# Patient Record
Sex: Male | Born: 1985
Health system: Southern US, Community
[De-identification: ages and names within clinical notes are randomized; demographics above are authoritative.]

## PROBLEM LIST (undated history)

## (undated) DIAGNOSIS — E782 Mixed hyperlipidemia: Secondary | ICD-10-CM

## (undated) HISTORY — DX: Mixed hyperlipidemia: E78.2

---

## 2017-07-21 ENCOUNTER — Encounter (HOSPITAL_COMMUNITY): Payer: Self-pay

## 2017-07-21 ENCOUNTER — Emergency Department (HOSPITAL_COMMUNITY)
Admission: EM | Admit: 2017-07-21 | Discharge: 2017-07-21 | Disposition: A | Discharge status: Home / Self Care | Payer: Medicaid Other | Attending: Emergency Medicine | Admitting: Emergency Medicine

## 2017-07-21 ENCOUNTER — Emergency Department (HOSPITAL_COMMUNITY): Payer: Medicaid Other

## 2017-07-21 ENCOUNTER — Other Ambulatory Visit: Payer: Self-pay

## 2017-07-21 DIAGNOSIS — M940 Chondrocostal junction syndrome [Tietze]: Secondary | ICD-10-CM

## 2017-07-21 DIAGNOSIS — R0789 Other chest pain: Secondary | ICD-10-CM

## 2017-07-21 DIAGNOSIS — R079 Chest pain, unspecified: Secondary | ICD-10-CM | POA: Diagnosis present

## 2017-07-21 LAB — BASIC METABOLIC PANEL
Anion gap: 9 (ref 5–15)
BUN: 17 mg/dL (ref 6–20)
CHLORIDE: 102 mmol/L (ref 101–111)
CO2: 26 mmol/L (ref 22–32)
CREATININE: 0.92 mg/dL (ref 0.61–1.24)
Calcium: 9.7 mg/dL (ref 8.9–10.3)
GFR calc Af Amer: >60 mL/min (ref >60)
GFR calc non Af Amer: >60 mL/min (ref >60)
Glucose, Bld: 111 mg/dL — ABNORMAL HIGH (ref 65–99)
Potassium: 4.3 mmol/L (ref 3.5–5.1)
SODIUM: 137 mmol/L (ref 135–145)

## 2017-07-21 LAB — CBC
HCT: 46.5 % (ref 39.0–52.0)
Hemoglobin: 16.4 g/dL (ref 13.0–17.0)
MCH: 30.4 pg (ref 26.0–34.0)
MCHC: 35.3 g/dL (ref 30.0–36.0)
MCV: 86.1 fL (ref 78.0–100.0)
Platelets: 261 10*3/uL (ref 150–400)
RBC: 5.4 MIL/uL (ref 4.22–5.81)
RDW: 12.9 % (ref 11.5–15.5)
WBC: 5.7 10*3/uL (ref 4.0–10.5)

## 2017-07-21 LAB — I-STAT TROPONIN, ED
TROPONIN I, POC: 0 ng/mL (ref 0.00–0.08)
Troponin i, poc: 0 ng/mL (ref 0.00–0.08)

## 2017-07-21 MED ORDER — IBUPROFEN 400 MG PO TABS
600.0000 mg | ORAL_TABLET | Freq: Once | ORAL | Status: AC
  Administered 2017-07-21: 15:00:00 600 mg via ORAL
  Filled 2017-07-21: qty 1

## 2017-07-21 MED ORDER — IBUPROFEN 600 MG PO TABS: 600.0000 mg | ORAL_TABLET | Freq: Four times a day (QID) | ORAL | 0 refills | Status: AC | PRN

## 2017-07-21 MED ORDER — OMEPRAZOLE 20 MG PO CPDR: 20.0000 mg | DELAYED_RELEASE_CAPSULE | Freq: Every day | ORAL | 0 refills | Status: DC

## 2017-07-21 NOTE — ED Provider Notes (Signed)
[facility] Provider Note  CSN: [id number] Arrival date & time: 07/21/17  1101 History   Chief Complaint Chief Complaint  Patient presents with  . Chest Pain   HPI Travis Meadows is a 31 y.o. male.  The history is provided by the patient.  Chest Pain   This is a new problem. The current episode started 2 days ago. The problem occurs constantly. The problem has not changed since onset.The pain is associated with movement, raising an arm and lifting. The pain is present in the substernal region. The pain is mild. The quality of the pain is described as heavy. The pain does not radiate. Duration of episode(s) is 2 days. The symptoms are aggravated by deep breathing (moving, lifting). Pertinent negatives include no abdominal pain, no back pain, no cough, no fever, no palpitations, no shortness of breath and no vomiting. He has tried nothing for the symptoms. Risk factors include male gender.  Pertinent negatives for past medical history include no CAD, no COPD, no CHF, no diabetes, no DVT, no MI, no PE, no PVD and no recent injury.   History reviewed. No pertinent past medical history.  There are no active problems to display for this patient.  History reviewed. No pertinent surgical history.  Home Medications    Prior to Admission medications   Not on File   Family History History reviewed. No pertinent family history.  Social History Social History   Tobacco Use  . Smoking status: Never Smoker  Substance Use Topics  . Alcohol use: No    Frequency: Never  . Drug use: No   Allergies   Patient has no known allergies.  Review of Systems Review of Systems  Constitutional: Negative for chills and fever.  HENT: Negative for ear pain and sore throat.   Eyes: Negative for pain and visual disturbance.  Respiratory: Negative for cough and shortness of breath.   Cardiovascular: Positive for chest pain. Negative for palpitations.    Gastrointestinal: Negative for abdominal pain and vomiting.  Genitourinary: Negative for dysuria and hematuria.  Musculoskeletal: Negative for arthralgias and back pain.  Skin: Negative for color change and rash.  Neurological: Negative for syncope.  All other systems reviewed and are negative.  Physical Exam Updated Vital Signs BP 130/84 (BP Location: Right Arm) Comment: Simultaneous filing. User may not have seen previous data.  Pulse 64   Temp 98.4 F (36.9 C) (Oral)   Resp 20 Comment: Simultaneous filing. User may not have seen previous data.  Ht 5\' 7"  (1.702 m)   Wt 90.7 kg (200 lb)   SpO2 99%   BMI 31.32 kg/m   Physical Exam  Constitutional: He is oriented to person, place, and time. He appears well-developed and well-nourished.  HENT:  Head: Normocephalic and atraumatic.  Eyes: Conjunctivae and EOM are normal. Pupils are equal, round, and reactive to light.  Neck: Normal range of motion. Neck supple.  Cardiovascular: Normal rate, regular rhythm, intact distal pulses and normal pulses.  No murmur heard. Pulmonary/Chest: Effort normal and breath sounds normal. No respiratory distress. He has no decreased breath sounds. He exhibits tenderness (over the sternal area). He exhibits no crepitus, no edema and no swelling.  Abdominal: Soft. There is no tenderness.  Musculoskeletal: Normal range of motion. He exhibits no edema.       Right lower leg: He exhibits no tenderness and no edema.       Left lower leg: He exhibits no tenderness and no edema.  Neurological: He is alert and oriented to person, place, and time.  Skin: Skin is warm and dry.  Psychiatric: He has a normal mood and affect. His behavior is normal.  Nursing note and vitals reviewed.  ED Treatments / Results  Labs (all labs ordered are listed, but only abnormal results are displayed) Labs Reviewed  BASIC METABOLIC PANEL - Abnormal; Notable for the following components:      Result Value   Glucose, Bld 111  (*)    All other components within normal limits  CBC  I-STAT TROPONIN, ED  I-STAT TROPONIN, ED   EKG  EKG Interpretation  Date/Time:  Friday July 21 2017 11:23:19 EST Ventricular Rate:  69 PR Interval:  154 QRS Duration: 86 QT Interval:  358 QTC Calculation: 383 R Axis:   64 Text Interpretation:  Normal sinus rhythm with sinus arrhythmia Normal ECG No old tracing to compare Confirmed by Azalia Bilisampos, Kevin ([id number]) on 07/21/2017 3:22:22 PM      Radiology Dg Chest 2 View  Result Date: 07/21/2017 CLINICAL DATA:  Chest pain EXAM: CHEST  2 VIEW COMPARISON:  None. FINDINGS: Normal heart size. Normal mediastinal contour. No pneumothorax. No pleural effusion. Lungs appear clear, with no acute consolidative airspace disease and no pulmonary edema. IMPRESSION: No active cardiopulmonary disease. Electronically Signed   By: Delbert PhenixJason A Poff M.D.   On: 07/21/2017 12:15   Procedures Procedures (including critical care time)  Medications Ordered in ED Medications  ibuprofen (ADVIL,MOTRIN) tablet 600 mg (600 mg Oral Given 07/21/17 1520)   Initial Impression / Assessment and Plan / ED Course  I have reviewed the triage vital signs and the nursing notes.  Pertinent labs & imaging results that were available during my care of the patient were reviewed by me and considered in my medical decision making (see chart for details).  Upon my evaluation patient patient endorses atypical chest pain today.  EKG I obtained reveals no anatomical ischemia representing STEMI, New-onset Arrhythmia, or ischemic equivalent. Therefore do not suspect ACS at this time. No concerns for Pericardial Tamponade on EKG and in light of patients hemodynamic stability. No pain related to supine or prone positions and given EKG doubt Pericarditis.   Per the patient's absence of CAD will send Troponin. First of which is negative, Repeat troponin at 3 hour interval was also negative. Therefore also do not suspect Myocarditis.    CXR unremarkable for focal airspace disease, patient is afebrile,  cough, and WBC shows no leukocytosis, do not suspect Pneumonia. Without evidence of Pneumothorax. CXR without concern for Esophageal Tear and there is no recent intractable emesis or esophageal instrumentation. No peritonitis or free air on CXR worrisome for Perforated Abdominal Viscous.  Unlikely Pulmonary Embolism as patient denies estrogen supplementation.  Age <50 years. [age] y.o.). Denies malignancy with treatment in last 6 months. No previous Hx of DVT/PE. Patient denies hemoptysis. No unilateral leg swelling observed on exam. Oxygenation saturation has been maintained >95% & HR has been <100 since since arrival to the ED. No recent surgery or trauma to lower extremities or travel involving prolonged car or plane ride. Therefore will not obtain CTA Chest or D-dimer.  Pain is not described as tearing and does not radiate to back, doubt Aortic Dissection. Pulses present bilaterally in upper and lower extremities. CXR does not show widened mediastinum.  Pain is worsened by palpation and with movement at this time most likely secondary to musculoskeletal pain. Patient also endorsed burning pain worse with meals that has been present  since arriving to the [place] from [place].   My H&P and patient's clinical course is not currently consistent with any of the above emergency medical conditions at this time. Consider discharge reasonable. Will give Rx for ibuprofen and for Prilosec.    Final Clinical Impressions(s) / ED Diagnoses   Final diagnoses:  Chest wall pain  Costochondritis    ED Discharge Orders        Ordered    ibuprofen (ADVIL,MOTRIN) 600 MG tablet  Every 6 hours PRN     07/21/17 1539    omeprazole (PRILOSEC) 20 MG capsule  Daily     07/21/17 1554       Lamont Snowball, MD 07/21/17 1557    Azalia Bilis, MD 07/21/17 2132

## 2017-07-21 NOTE — ED Triage Notes (Signed)
Pt endorses generalized chest pain x 2 days. [facility] and was sent here for concerning EKG reading. VSS. NO associated sx

## 2017-07-21 NOTE — ED Notes (Signed)
Patient verbalizes understanding of discharge instructions. Opportunity for questioning and answers were provided. 

## 2017-07-21 NOTE — ED Notes (Signed)
MD resident bedside

## 2017-11-11 ENCOUNTER — Emergency Department (HOSPITAL_COMMUNITY)
Admission: EM | Admit: 2017-11-11 | Discharge: 2017-11-11 | Disposition: A | Discharge status: Home / Self Care | Payer: Medicaid Other | Attending: Emergency Medicine | Admitting: Emergency Medicine

## 2017-11-11 ENCOUNTER — Emergency Department (HOSPITAL_COMMUNITY): Payer: Medicaid Other

## 2017-11-11 ENCOUNTER — Other Ambulatory Visit: Payer: Self-pay

## 2017-11-11 ENCOUNTER — Encounter (HOSPITAL_COMMUNITY): Payer: Self-pay

## 2017-11-11 DIAGNOSIS — Z79899 Other long term (current) drug therapy: Secondary | ICD-10-CM | POA: Insufficient documentation

## 2017-11-11 DIAGNOSIS — R0789 Other chest pain: Secondary | ICD-10-CM | POA: Insufficient documentation

## 2017-11-11 DIAGNOSIS — R079 Chest pain, unspecified: Secondary | ICD-10-CM | POA: Diagnosis present

## 2017-11-11 LAB — BASIC METABOLIC PANEL
Anion gap: 10 (ref 5–15)
BUN: 20 mg/dL (ref 6–20)
CALCIUM: 8.8 mg/dL — AB (ref 8.9–10.3)
CO2: 21 mmol/L — ABNORMAL LOW (ref 22–32)
CREATININE: 0.97 mg/dL (ref 0.61–1.24)
Chloride: 106 mmol/L (ref 101–111)
GFR calc non Af Amer: >60 mL/min (ref >60)
Glucose, Bld: 115 mg/dL — ABNORMAL HIGH (ref 65–99)
Potassium: 3.8 mmol/L (ref 3.5–5.1)
SODIUM: 137 mmol/L (ref 135–145)

## 2017-11-11 LAB — CBC
HCT: 44.9 % (ref 39.0–52.0)
Hemoglobin: 15.8 g/dL (ref 13.0–17.0)
MCH: 30.4 pg (ref 26.0–34.0)
MCHC: 35.2 g/dL (ref 30.0–36.0)
MCV: 86.5 fL (ref 78.0–100.0)
Platelets: 254 10*3/uL (ref 150–400)
RBC: 5.19 MIL/uL (ref 4.22–5.81)
RDW: 12.9 % (ref 11.5–15.5)
WBC: 4.5 10*3/uL (ref 4.0–10.5)

## 2017-11-11 LAB — I-STAT TROPONIN, ED: TROPONIN I, POC: 0 ng/mL (ref 0.00–0.08)

## 2017-11-11 MED ORDER — NAPROXEN 375 MG PO TABS: 375.0000 mg | ORAL_TABLET | Freq: Two times a day (BID) | ORAL | 0 refills | Status: AC

## 2017-11-11 NOTE — ED Provider Notes (Signed)
[facility] EMERGENCY DEPARTMENT Provider Note   CSN: [id number] Arrival date & time: 11/11/17  4098     History   Chief Complaint Chief Complaint  Patient presents with  . Chest Pain    HPI Travis Meadows is a 32 y.o. male.  Patient is a 32 year old male who presents with chest pain.  He has no significant past medical history.  He states over the last 2 or 3 months he has had some pain over his sternum.  It gets better with Naprosyn but it is worse with certain movements and laying on his left side.  He denies any shortness of breath.  No pleuritic pain.  No cough or chest congestion.  No leg pain or swelling.  No nausea or vomiting.  He was seen in December for similar symptoms and diagnosed with chest wall pain.  He was given Naprosyn which he does say improved his symptoms.  No history of heart disease.  He denies any known injuries to his chest wall.  He does say that occasionally he has pain in his other joints, primarily in his hands when he is working.      History reviewed. No pertinent past medical history.  There are no active problems to display for this patient.   History reviewed. No pertinent surgical history.     Home Medications    Prior to Admission medications   Medication Sig Start Date End Date Taking? Authorizing Provider  naproxen (NAPROSYN) 375 MG tablet Take 1 tablet (375 mg total) by mouth 2 (two) times daily. 11/11/17   Rolan Bucco, MD  omeprazole (PRILOSEC) 20 MG capsule Take 1 capsule (20 mg total) daily by mouth. 07/21/17 08/20/17  Lamont Snowball, MD    Family History No family history on file.  Social History Social History   Tobacco Use  . Smoking status: Never Smoker  . Smokeless tobacco: Never Used  Substance Use Topics  . Alcohol use: No    Frequency: Never  . Drug use: No     Allergies   Patient has no known allergies.   Review of Systems Review of Systems  Constitutional: Negative for chills,  diaphoresis, fatigue and fever.  HENT: Negative for congestion, rhinorrhea and sneezing.   Eyes: Negative.   Respiratory: Negative for cough, chest tightness and shortness of breath.   Cardiovascular: Positive for chest pain. Negative for leg swelling.  Gastrointestinal: Negative for abdominal pain, blood in stool, diarrhea, nausea and vomiting.  Genitourinary: Negative for difficulty urinating, flank pain, frequency and hematuria.  Musculoskeletal: Positive for arthralgias. Negative for back pain.  Skin: Negative for rash.  Neurological: Negative for dizziness, speech difficulty, weakness, numbness and headaches.     Physical Exam Updated Vital Signs BP (!) 128/91   Pulse 64   Temp 98.5 F (36.9 C) (Oral)   Resp 13   Ht 6' (1.829 m)   Wt 90.7 kg (200 lb)   SpO2 99%   BMI 27.12 kg/m   Physical Exam  Constitutional: He is oriented to person, place, and time. He appears well-developed and well-nourished.  HENT:  Head: Normocephalic and atraumatic.  Eyes: Pupils are equal, round, and reactive to light.  Neck: Normal range of motion. Neck supple.  Cardiovascular: Normal rate, regular rhythm and normal heart sounds.  Positive tenderness on palpation of the left sternum, no crepitus or deformity  Pulmonary/Chest: Effort normal and breath sounds normal. No respiratory distress. He has no wheezes. He has no rales. He exhibits no  tenderness.  Abdominal: Soft. Bowel sounds are normal. There is no tenderness. There is no rebound and no guarding.  Musculoskeletal: Normal range of motion. He exhibits no edema.  Lymphadenopathy:    He has no cervical adenopathy.  Neurological: He is alert and oriented to person, place, and time.  Skin: Skin is warm and dry. No rash noted.  Psychiatric: He has a normal mood and affect.     ED Treatments / Results  Labs (all labs ordered are listed, but only abnormal results are displayed) Labs Reviewed  BASIC METABOLIC PANEL - Abnormal; Notable for  the following components:      Result Value   CO2 21 (*)    Glucose, Bld 115 (*)    Calcium 8.8 (*)    All other components within normal limits  CBC  I-STAT TROPONIN, ED    EKG  EKG Interpretation  Date/Time:  Saturday November 11 2017 06:51:59 EST Ventricular Rate:  65 PR Interval:    QRS Duration: 85 QT Interval:  355 QTC Calculation: 369 R Axis:   67 Text Interpretation:  Sinus arrhythmia ST elevation suggests acute pericarditis since last tracing no significant change Confirmed by Rolan BuccoBelfi, [place] Anagnos [phone]([id number]) on 11/11/2017 7:04:46 AM       Radiology Dg Chest 2 View  Result Date: 11/11/2017 CLINICAL DATA:  Intermittent chest pain. EXAM: CHEST  2 VIEW COMPARISON:  July 21, 2017 FINDINGS: The heart size and mediastinal contours are within normal limits. Both lungs are clear. The visualized skeletal structures are unremarkable. IMPRESSION: No active cardiopulmonary disease. Electronically Signed   By: Gerome Samavid  Williams III M.D   On: 11/11/2017 07:22    Procedures Procedures (including critical care time)  Medications Ordered in ED Medications - No data to display   Initial Impression / Assessment and Plan / ED Course  I have reviewed the triage vital signs and the nursing notes.  Pertinent labs & imaging results that were available during my care of the patient were reviewed by me and considered in my medical decision making (see chart for details).     Patient is a 32 year old male who presents with a 10-1691-month history of pain in his sternum.  I do not see any visible abnormalities.  Chest x-ray does not reveal any fractures or gross.  The pain is reproducible on palpation of the sternum.  There is no crepitus or deformity.  This is likely musculoskeletal.  His EKG does not show any ischemic changes.  His chest x-ray is negative for pneumonia or bony abnormalities.  His labs are non-concerning.  He was discharged home in good condition.  He was encouraged to [facility]  He does report some vague joint aches.  There is no visible joint swelling.  I did advise him he can have further evaluation of this through his primary care physician.  He was given a prescription for Naprosyn.  Return precautions were given.  Final Clinical Impressions(s) / ED Diagnoses   Final diagnoses:  Chest wall pain    ED Discharge Orders        Ordered    naproxen (NAPROSYN) 375 MG tablet  2 times daily     11/11/17 [id number]       Rolan BuccoBelfi, Joanmarie Tsang, MD 11/11/17 [phone]

## 2017-11-11 NOTE — ED Triage Notes (Signed)
Patient reports intermittent central chest pain since Dec 2018. Pt states he was seen here around that time for the same. Currently rates pain 7/10. Patient in NAD.

## 2018-02-15 ENCOUNTER — Ambulatory Visit: Payer: Self-pay | Attending: Internal Medicine | Admitting: Internal Medicine

## 2018-02-15 ENCOUNTER — Encounter: Payer: Self-pay | Admitting: Internal Medicine

## 2018-02-15 VITALS — BP 135/89 | HR 72 | Temp 98.4°F | Resp 16 | Ht 70.0 in | Wt 223.6 lb

## 2018-02-15 DIAGNOSIS — M79642 Pain in left hand: Secondary | ICD-10-CM | POA: Insufficient documentation

## 2018-02-15 DIAGNOSIS — M7582 Other shoulder lesions, left shoulder: Secondary | ICD-10-CM | POA: Insufficient documentation

## 2018-02-15 DIAGNOSIS — M79641 Pain in right hand: Secondary | ICD-10-CM | POA: Insufficient documentation

## 2018-02-15 DIAGNOSIS — M25512 Pain in left shoulder: Secondary | ICD-10-CM | POA: Insufficient documentation

## 2018-02-15 MED ORDER — MELOXICAM 15 MG PO TABS: 15.0000 mg | ORAL_TABLET | Freq: Every day | ORAL | 1 refills | Status: AC

## 2018-02-15 MED FILL — MELOXICAM 15 MG TABLET: 15 | 30 days supply | Qty: 30 | Fill #0

## 2018-02-15 NOTE — Progress Notes (Signed)
Patient ID: Travis Meadows, male    DOB: 1986-05-07  MRN: [id number]  CC: New Patient (Initial Visit); Hand Pain; and Shoulder Pain   Subjective: Travis GaryMartin Meadows is a 32 y.o. male who presents for new pt appt.  [place]. Pt speaks english. He has been in [place] for 9 mths His concerns today include:    pt c/o pain at base of both thumb and RT side 1st MCP x 6 mths Pain RT side constant , Lt side intermittent.  Worse on days off work No swelling  No personal hx or fhx of arthritis Also pain LT shoulder x 5 mths. No injury. Intermittent.  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  He works 5 days a week.  Pain is worse after he shifts  Current Outpatient Medications on File Prior to Visit  Medication Sig Dispense Refill  . naproxen (NAPROSYN) 375 MG tablet Take 1 tablet (375 mg total) by mouth 2 (two) times daily. (Patient not taking: Reported on 02/15/2018) 20 tablet 0  . omeprazole (PRILOSEC) 20 MG capsule Take 1 capsule (20 mg total) daily by mouth. 30 capsule 0   No current facility-administered medications on file prior to visit.     No Known Allergies  Social History   Socioeconomic History  . Marital status: Married    Spouse name: Not on file  . Number of children: 4  . Years of education: Not on file  . Highest education level: Not on file  Occupational History  . Occupation: chicken farm  Social Needs  . Financial resource strain: Not on file  . Food insecurity:    Worry: Not on file    Inability: Not on file  . Transportation needs:    Medical: Not on file    Non-medical: Not on file  Tobacco Use  . Smoking status: Never Smoker  . Smokeless tobacco: Never Used  Substance and Sexual Activity  . Alcohol use: No    Frequency: Never  . Drug use: No  . Sexual activity: Not on file  Lifestyle  . Physical activity:    Days per  week: Not on file    Minutes per session: Not on file  . Stress: Not on file  Relationships  . Social connections:    Talks on phone: Not on file    Gets together: Not on file    Attends religious service: Not on file    Active member of club or organization: Not on file    Attends meetings of clubs or organizations: Not on file    Relationship status: Not on file  . Intimate partner violence:    Fear of current or ex partner: Not on file    Emotionally abused: Not on file    Physically abused: Not on file    Forced sexual activity: Not on file  Other Topics Concern  . Not on file  Social History Narrative  . Not on file    No family history on file.  No past surgical history on file.  ROS: Review of Systems Negative except as stated above PHYSICAL EXAM: BP 135/89   Pulse 72   Temp 98.4 F (36.9 C) (Oral)   Resp 16   Ht 5\' 10"  (1.778 m)   Wt 223 lb 9.6  oz (101.4 kg)   SpO2 97%   BMI 32.08 kg/m   Physical Exam   General appearance - alert, well appearing, and in no distress Mental status - normal mood, behavior, speech, dress, motor activity, and thought processes Chest - clear to auscultation, no wheezes, rales or rhonchi, symmetric air entry Heart - normal rate, regular rhythm, normal S1, S2, no murmurs, rubs, clicks or gallops Musculoskeletal -hands: No signs of active tenosynovitis.  Mild tenderness on palpation of the base of both thumbs and MCP index finger right hand. Left shoulder: Discomfort on passive and active range of motion across upper body.  Painful arc test is positive between 90 to 120 degrees.  Drop arm test negative  ASSESSMENT AND PLAN: 1. Tendinitis of left rotator cuff 2. Bilateral hand pain I think both his shoulder and hand pain are occupation related.  Quitting his job at this point is not an option for him.  We will start him on meloxicam.  Recommend soaking hands in warm water after his shift.  Referral given to orthopedics for possible  steroid injection to the left shoulder - meloxicam (MOBIC) 15 MG tablet; Take 1 tablet (15 mg total) by mouth daily.  Dispense: 30 tablet; Refill: 1  Patient was given the opportunity to ask questions.  Patient verbalized understanding of the plan and was able to repeat key elements of the plan.   Orders Placed This Encounter  Procedures  . Ambulatory referral to Orthopedic Surgery     Requested Prescriptions   Signed Prescriptions Disp Refills  . meloxicam (MOBIC) 15 MG tablet 30 tablet 1    Sig: Take 1 tablet (15 mg total) by mouth daily.    Return if symptoms worsen or fail to improve.  Jonah Blue, MD, FACP

## 2018-02-15 NOTE — Patient Instructions (Signed)
Rotator Cuff Tendinitis Rotator cuff tendinitis is inflammation of the tough, cord-like bands that connect muscle to bone (tendons) in the rotator cuff. The rotator cuff includes all of the muscles and tendons that connect the arm to the shoulder. The rotator cuff holds the head of the upper arm bone (humerus) in the cup (fossa) of the shoulder blade (scapula). This condition can lead to a long-lasting (chronic) tear. The tear may be partial or complete. What are the causes? This condition is usually caused by overusing the rotator cuff. What increases the risk? This condition is more likely to develop in athletes and workers who frequently use their shoulder or reach over their heads. This can include activities such as:  Tennis.  Baseball or softball.  Swimming.  Construction work.  Painting.  What are the signs or symptoms? Symptoms of this condition include:  Pain spreading (radiating) from the shoulder to the upper arm.  Swelling and tenderness in front of the shoulder.  Pain when reaching, pulling, or lifting the arm above the head.  Pain when lowering the arm from above the head.  Minor pain in the shoulder when resting.  Increased pain in the shoulder at night.  Difficulty placing the arm behind the back.  How is this diagnosed? This condition is diagnosed with a medical history and physical exam. Tests may also be done, including:  X-rays.  MRI.  Ultrasounds.  CT or MR arthrogram. During this test, a contrast material is injected and then images are taken.  How is this treated? Treatment for this condition depends on the severity of the condition. In less severe cases, treatment may include:  Rest. This may be done with a sling that holds the shoulder still (immobilization). Your health care provider may also recommend avoiding activities that involve lifting your arm over your head.  Icing the shoulder.  Anti-inflammatory medicines, such as aspirin or  ibuprofen.  In more severe cases, treatment may include:  Physical therapy.  Steroid injections.  Surgery.  Follow these instructions at home: If you have a sling:  Wear the sling as told by your health care provider. Remove it only as told by your health care provider.  Loosen the sling if your fingers tingle, become numb, or turn cold and blue.  Keep the sling clean.  If the sling is not waterproof, do not let it get wet. Remove it, if allowed, or cover it with a watertight covering when you take a bath or shower. Managing pain, stiffness, and swelling  If directed, put ice on the injured area. ? If you have a removable sling, remove it as told by your health care provider. ? Put ice in a plastic bag. ? Place a towel between your skin and the bag. ? Leave the ice on for 20 minutes, 2-3 times a day.  Move your fingers often to avoid stiffness and to lessen swelling.  Raise (elevate) the injured area above the level of your heart while you are lying down.  Find a comfortable sleeping position or sleep on a recliner, if available. Driving  Do not drive or use heavy machinery while taking prescription pain medicine.  Ask your health care provider when it is safe to drive if you have a sling on your arm. Activity  Rest your shoulder as told by your health care provider.  Return to your normal activities as told by your health care provider. Ask your health care provider what activities are safe for you.  Do any   exercises or stretches as told by your health care provider.  If you do repetitive overhead tasks, take small breaks in between and include stretching exercises as told by your health care provider. General instructions  Do not use any products that contain nicotine or tobacco, such as cigarettes and e-cigarettes. These can delay healing. If you need help quitting, ask your health care provider.  Take over-the-counter and prescription medicines only as told by  your health care provider.  Keep all follow-up visits as told by your health care provider. This is important. Contact a health care provider if:  Your pain gets worse.  You have new pain in your arm, hands, or fingers.  Your pain is not relieved with medicine or does not get better after 6 weeks of treatment.  You have cracking sensations when moving your shoulder in certain directions.  You hear a snapping sound after using your shoulder, followed by severe pain and weakness. Get help right away if:  Your arm, hand, or fingers are numb or tingling.  Your arm, hand, or fingers are swollen or painful or they turn white or blue. Summary  Rotator cuff tendinitis is inflammation of the tough, cord-like bands that connect muscle to bone (tendons) in the rotator cuff.  This condition is usually caused by overusing the rotator cuff, which includes all of the muscles and tendons that connect the arm to the shoulder.  This condition is more likely to develop in athletes and workers who frequently use their shoulder or reach over their heads.  Treatment generally includes rest, anti-inflammatory medicines, and icing. In some cases, physical therapy and steroid injections may be needed. In severe cases, surgery may be needed. This information is not intended to replace advice given to you by your health care provider. Make sure you discuss any questions you have with your health care provider. Document Released: 11/19/2003 Document Revised: 08/15/2016 Document Reviewed: 08/15/2016 Elsevier Interactive Patient Education  2017 [employer]  

## 2019-07-02 IMAGING — CR DG CHEST 2V
2 series · 2 of 2 positions shown · non-contrast
Comparison: July 21, 2017

CLINICAL DATA: Intermittent chest pain.

EXAM:
CHEST  2 VIEW

[chest lat]
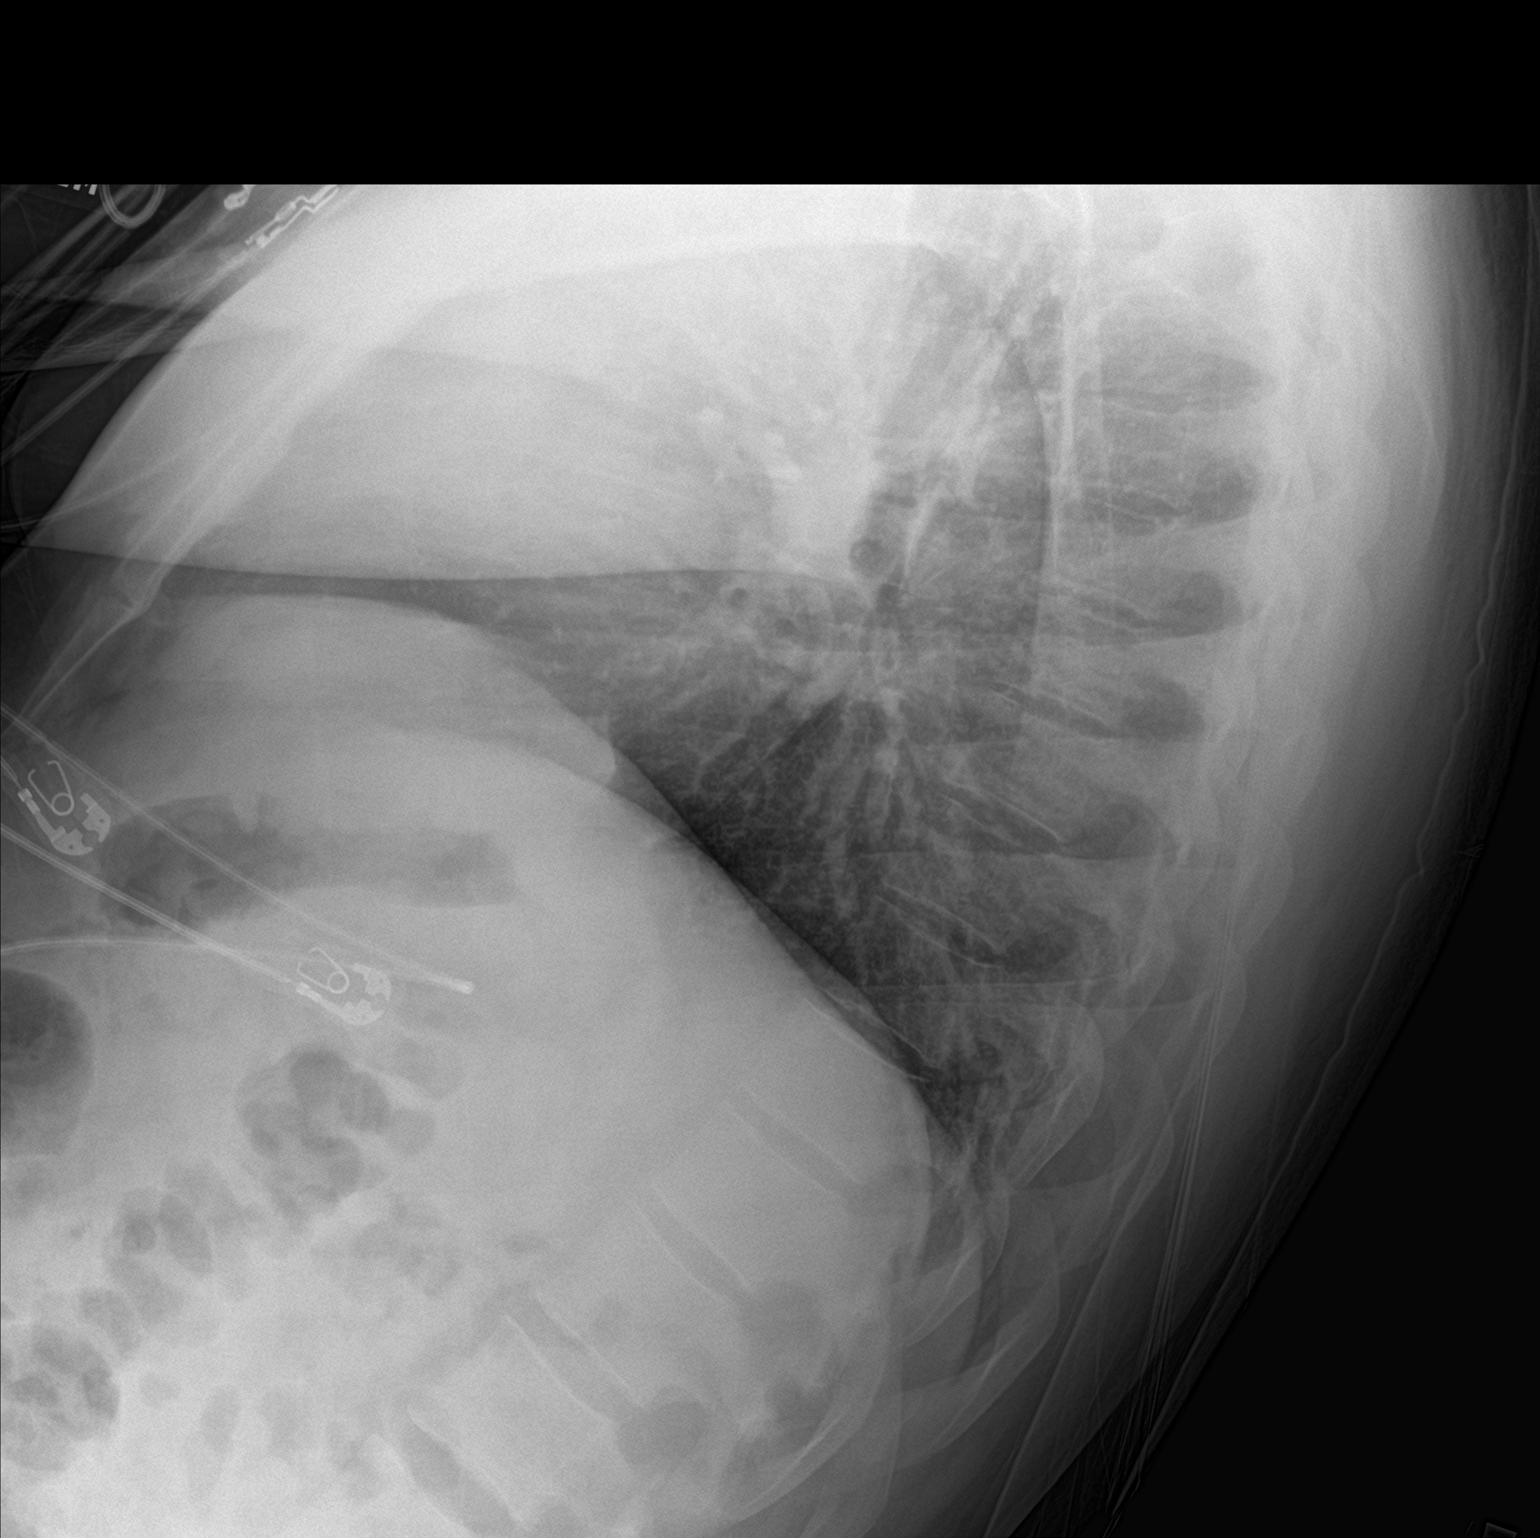

[chest ap]
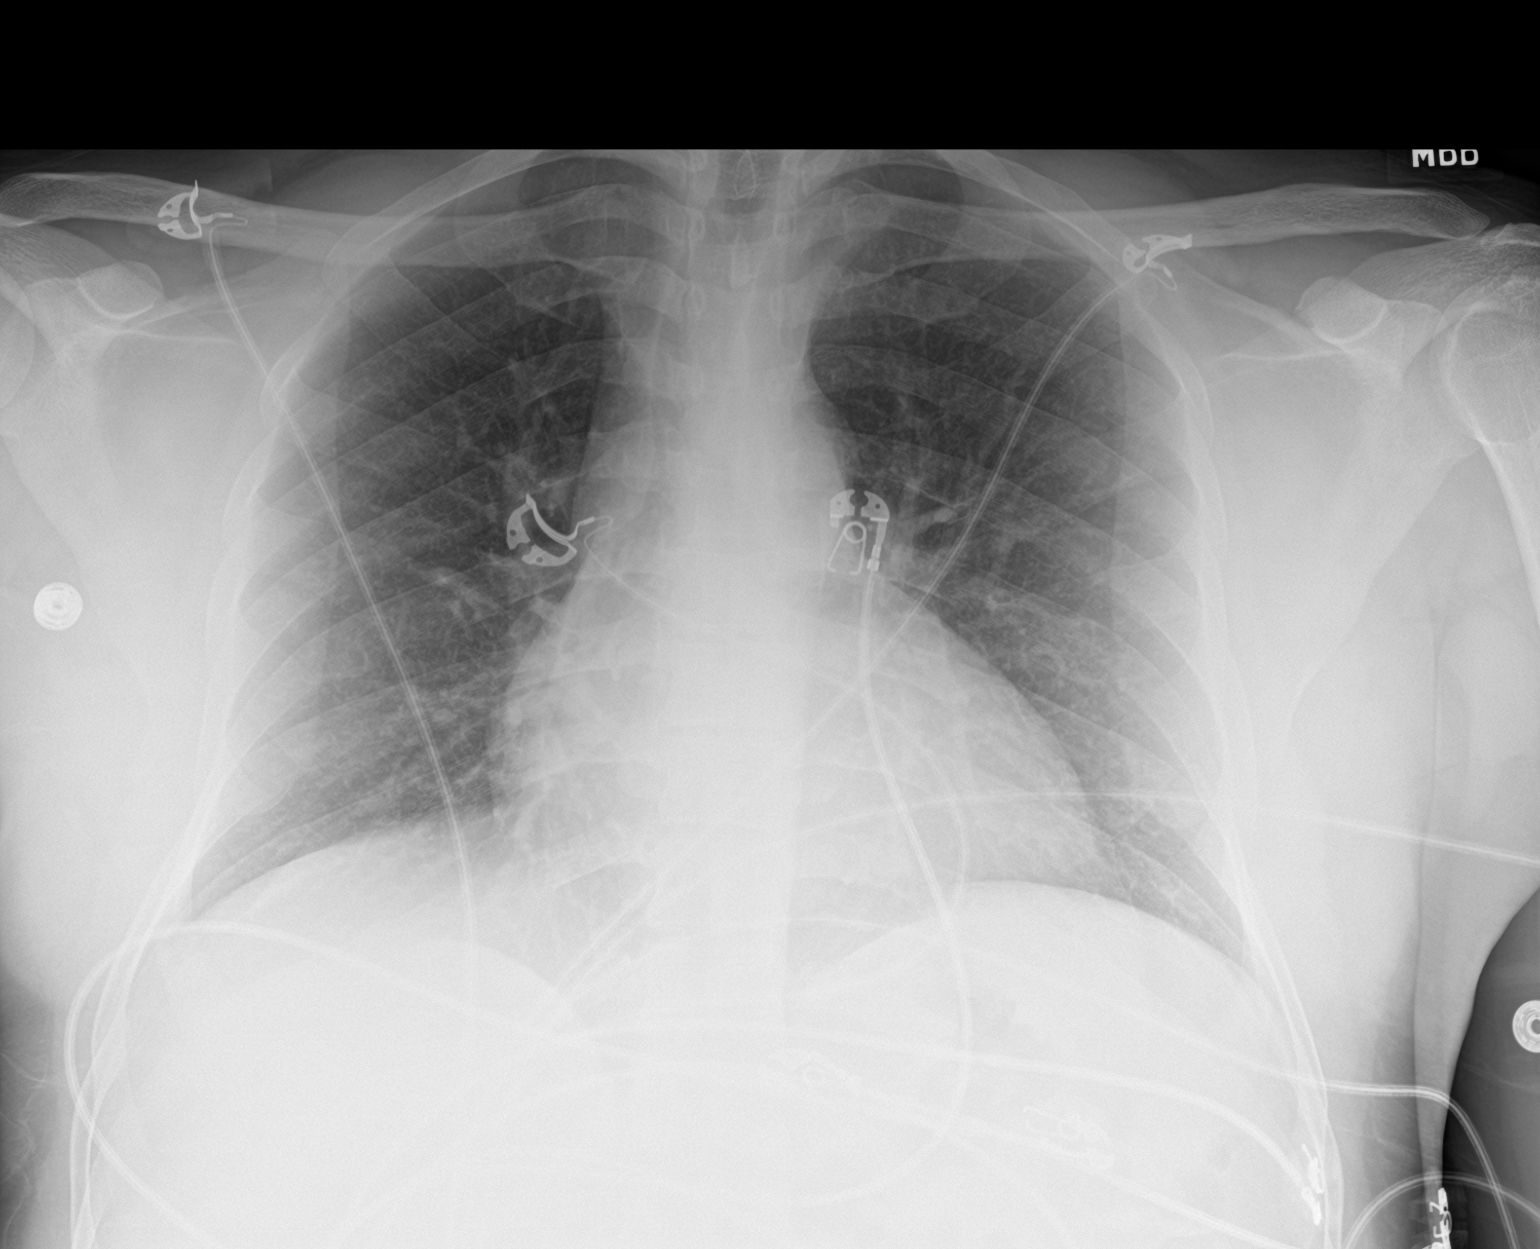

[2 of 2 positions shown; findings below may reference images not displayed]

FINDINGS: The heart size and mediastinal contours are within normal limits.
Both lungs are clear. The visualized skeletal structures are
unremarkable.
IMPRESSION: No active cardiopulmonary disease.

## 2020-10-30 NOTE — Progress Notes (Signed)
Patient referred by [facility] for chest pain  Subjective:   Travis Meadows, male    DOB: August 02, 1986, 35 y.o.   MRN: [id number]   Chief Complaint  Patient presents with  . Chest Pain  . New Patient (Initial Visit)     HPI  35 y.o. [place] male with hyperlipidemia, chest pain  Patient is originally from [place], has been in [place] since 2018. His wife and two kids live in [place]. Patient works at a chicken farm. He reports episodes of retrosternal chest pressure, associated with shortness of breath, that occurs both at rest and exertion, but does get worse with physical activity. Episodes last for anywhere between few min to several hours. He also reports headaches and sinus pressure for a long time.   Past Medical History:  Diagnosis Date  . Hyperlipidemia, mixed     History reviewed. No pertinent surgical history.   Social History   Tobacco Use  Smoking Status Never Smoker  Smokeless Tobacco Never Used    Social History   Substance and Sexual Activity  Alcohol Use No     Family History  Problem Relation Age of Onset  . HIV Sister      Current Outpatient Medications on File Prior to Visit  Medication Sig Dispense Refill  . atorvastatin (LIPITOR) 40 MG tablet Take 40 mg by mouth at bedtime.    . cetirizine (ZYRTEC) 10 MG tablet Take 10 mg by mouth daily.    . meloxicam (MOBIC) 15 MG tablet Take 1 tablet (15 mg total) by mouth daily. 30 tablet 1  . naproxen (NAPROSYN) 375 MG tablet Take 1 tablet (375 mg total) by mouth 2 (two) times daily. 20 tablet 0   No current facility-administered medications on file prior to visit.    Cardiovascular and other pertinent studies:   EKG 10/23/2020:  Sinus Rhythm, within normal limits Sinus rhythm 70 bpm Normal EKG  Recent labs: 10/23/2020: Glucose 83, BUN/Cr 19/0.98. EGFR 114. Na/K 142/4.2. Rest of the CMP normal H/H 16.1/46.6. MCV 88.1. Platelets 274 HbA1C 4.9% Chol 218, TG 63, HDL 38,  LDL 164 TSH N/A   Review of Systems  HENT: Positive for congestion.   Cardiovascular: Positive for chest pain and dyspnea on exertion. Negative for leg swelling, palpitations and syncope.  Respiratory: Positive for shortness of breath.          Vitals:   11/02/20 1057 11/02/20 1058  BP: (!) 135/100 127/88  Pulse: 85 78  SpO2: 98%      Body mass index is 36.32 kg/m. Filed Weights   11/02/20 1057  Weight: 225 lb (102.1 kg)     Objective:   Physical Exam Vitals and nursing note reviewed.  Constitutional:      General: He is not in acute distress. Neck:     Vascular: No JVD.  Cardiovascular:     Rate and Rhythm: Normal rate and regular rhythm.     Heart sounds: Normal heart sounds. No murmur heard.   Pulmonary:     Effort: Pulmonary effort is normal.     Breath sounds: Normal breath sounds. No wheezing or rales.  Musculoskeletal:     Right lower leg: No edema.     Left lower leg: No edema.         Assessment & Recommendations:   35 y.o. [place] male with hyperlipidemia, chest pain  Chest pain: Features of both typical and atypical angina. Risk factor includes mixed hyperlipidemia. Recommend exercise nuclear  stress test and echcoardiogram Until further workup, recommend Aspirin 81 mg, nitro prn  Mixed hyperlipidemia: Continue Lipitor.   Thank you for referring the patient to [place]. Please feel free to contact with any questions.   Nigel Mormon, MD Pager: [phone] Office: [phone]

## 2020-11-02 ENCOUNTER — Other Ambulatory Visit: Payer: Self-pay

## 2020-11-02 ENCOUNTER — Ambulatory Visit: Payer: PRIVATE HEALTH INSURANCE | Admitting: Cardiology

## 2020-11-02 ENCOUNTER — Encounter: Payer: Self-pay | Admitting: Cardiology

## 2020-11-02 VITALS — BP 127/88 | HR 78 | Ht 66.0 in | Wt 225.0 lb

## 2020-11-02 DIAGNOSIS — R072 Precordial pain: Secondary | ICD-10-CM | POA: Insufficient documentation

## 2020-11-02 MED ORDER — NITROGLYCERIN 0.4 MG SL SUBL: 0.4000 mg | SUBLINGUAL_TABLET | SUBLINGUAL | 3 refills | Status: AC | PRN

## 2020-11-02 MED ORDER — ASPIRIN EC 81 MG PO TBEC: 81.0000 mg | DELAYED_RELEASE_TABLET | Freq: Every day | ORAL | 3 refills | Status: AC

## 2020-11-09 ENCOUNTER — Other Ambulatory Visit: Payer: Self-pay

## 2020-11-09 ENCOUNTER — Ambulatory Visit: Payer: PRIVATE HEALTH INSURANCE

## 2020-11-09 DIAGNOSIS — R072 Precordial pain: Secondary | ICD-10-CM

## 2020-11-09 NOTE — Progress Notes (Signed)
No significant heart muscle circulation abnormalities noted on stress test.  Thanks MJP   

## 2020-11-10 NOTE — Progress Notes (Signed)
Called and spoke with pt regarding stress test results. Pt voiced understanding.

## 2020-11-12 ENCOUNTER — Other Ambulatory Visit: Payer: PRIVATE HEALTH INSURANCE

## 2020-11-30 ENCOUNTER — Other Ambulatory Visit: Payer: PRIVATE HEALTH INSURANCE

## 2021-05-04 DIAGNOSIS — E782 Mixed hyperlipidemia: Secondary | ICD-10-CM | POA: Insufficient documentation

## 2021-05-04 NOTE — Progress Notes (Signed)
No show

## 2021-05-05 ENCOUNTER — Ambulatory Visit: Payer: PRIVATE HEALTH INSURANCE | Admitting: Cardiology

## 2021-05-05 DIAGNOSIS — E782 Mixed hyperlipidemia: Secondary | ICD-10-CM

## 2021-05-05 DIAGNOSIS — R072 Precordial pain: Secondary | ICD-10-CM

## 2021-05-25 ENCOUNTER — Ambulatory Visit: Payer: PRIVATE HEALTH INSURANCE | Admitting: Cardiology

## 2021-06-03 ENCOUNTER — Telehealth: Payer: Self-pay | Admitting: Cardiology

## 2021-06-23 ENCOUNTER — Telehealth: Payer: Self-pay | Admitting: Cardiology

## 2021-06-23 NOTE — Telephone Encounter (Signed)
This pt has no showed on multiple appts in office, and we have tried to reach pt to get rescheduled for echo and office visits multiple times with no success, I am going to remove the echo request at this time.
